# Patient Record
Sex: Male | Born: 1980 | Race: White | Hispanic: No | Marital: Married | Smoking: Current every day smoker
Health system: Southern US, Community
[De-identification: ages and names within clinical notes are randomized; demographics above are authoritative.]

## PROBLEM LIST (undated history)

## (undated) HISTORY — PX: CERVICAL FUSION: SHX112

## (undated) HISTORY — PX: CEREBRAL ANEURYSM REPAIR: SHX164

---

## 2013-07-22 ENCOUNTER — Emergency Department: Payer: Self-pay | Admitting: Emergency Medicine

## 2015-09-06 ENCOUNTER — Other Ambulatory Visit: Payer: Self-pay | Admitting: Neurology

## 2015-09-06 DIAGNOSIS — Z8249 Family history of ischemic heart disease and other diseases of the circulatory system: Secondary | ICD-10-CM

## 2015-09-06 DIAGNOSIS — G44229 Chronic tension-type headache, not intractable: Secondary | ICD-10-CM

## 2015-09-18 ENCOUNTER — Ambulatory Visit
Admission: RE | Admit: 2015-09-18 | Discharge: 2015-09-18 | Disposition: A | Discharge status: Home / Self Care | Payer: Managed Care, Other (non HMO) | Source: Ambulatory Visit | Attending: Neurology | Admitting: Neurology

## 2015-09-18 DIAGNOSIS — G44229 Chronic tension-type headache, not intractable: Secondary | ICD-10-CM | POA: Insufficient documentation

## 2015-09-18 DIAGNOSIS — Z8249 Family history of ischemic heart disease and other diseases of the circulatory system: Secondary | ICD-10-CM | POA: Insufficient documentation

## 2015-12-07 ENCOUNTER — Emergency Department
Admission: EM | Admit: 2015-12-07 | Discharge: 2015-12-07 | Disposition: A | Discharge status: Home / Self Care | Payer: Managed Care, Other (non HMO) | Attending: Emergency Medicine | Admitting: Emergency Medicine

## 2015-12-07 ENCOUNTER — Emergency Department: Payer: Managed Care, Other (non HMO)

## 2015-12-07 ENCOUNTER — Encounter: Payer: Self-pay | Admitting: Emergency Medicine

## 2015-12-07 DIAGNOSIS — F17210 Nicotine dependence, cigarettes, uncomplicated: Secondary | ICD-10-CM | POA: Diagnosis not present

## 2015-12-07 DIAGNOSIS — X501XXA Overexertion from prolonged static or awkward postures, initial encounter: Secondary | ICD-10-CM | POA: Insufficient documentation

## 2015-12-07 DIAGNOSIS — Y929 Unspecified place or not applicable: Secondary | ICD-10-CM | POA: Insufficient documentation

## 2015-12-07 DIAGNOSIS — S99912A Unspecified injury of left ankle, initial encounter: Secondary | ICD-10-CM | POA: Diagnosis present

## 2015-12-07 DIAGNOSIS — S93402A Sprain of unspecified ligament of left ankle, initial encounter: Secondary | ICD-10-CM | POA: Insufficient documentation

## 2015-12-07 DIAGNOSIS — Y9383 Activity, rough housing and horseplay: Secondary | ICD-10-CM | POA: Insufficient documentation

## 2015-12-07 DIAGNOSIS — Y999 Unspecified external cause status: Secondary | ICD-10-CM | POA: Diagnosis not present

## 2015-12-07 MED ORDER — IBUPROFEN 800 MG PO TABS
800.0000 mg | ORAL_TABLET | Freq: Once | ORAL | Status: AC
  Administered 2015-12-07: 800 mg via ORAL
  Filled 2015-12-07: qty 1

## 2015-12-07 MED ORDER — OXYCODONE-ACETAMINOPHEN 5-325 MG PO TABS
1.0000 | ORAL_TABLET | Freq: Once | ORAL | Status: AC
  Administered 2015-12-07: 1 via ORAL
  Filled 2015-12-07: qty 1

## 2015-12-07 MED ORDER — TRAMADOL HCL 50 MG PO TABS: 50.0000 mg | ORAL_TABLET | Freq: Four times a day (QID) | ORAL | 0 refills | Status: DC | PRN

## 2015-12-07 MED ORDER — IBUPROFEN 800 MG PO TABS: 800.0000 mg | ORAL_TABLET | Freq: Three times a day (TID) | ORAL | 0 refills | Status: DC | PRN

## 2015-12-07 NOTE — ED Notes (Signed)
Pt verbalized understanding of discharge instructions. NAD at this time. 

## 2015-12-07 NOTE — ED Triage Notes (Signed)
Pt presents to ED c/o L ankle injury. Pt states he was horsing around with a friend and L ankle got pinned between friend and couch. Swelling noted. Able to move toes.

## 2015-12-07 NOTE — ED Provider Notes (Signed)
[facility] Emergency Department Provider Note   ____________________________________________   First MD Initiated Contact with Patient 12/07/15 1430     (approximate)  I have reviewed the triage vital signs and the nursing notes.   HISTORY  Chief Complaint Ankle Pain (Left)    HPI Lucas SentersJeffrey L Gibson is a 35 y.o. male patient came to the ED complaining of left ankle pain and edema. Patient state his plan is for him and they both felt that his ankle pain between his friend a couch. Patient state immediate pain and edema. Patient is sent occurred approximately 2 AM this morning. Patient stated no palliative measures taken for this complaint.Patient rates his pain as 8/10. Patient had a pain as "throbbing".   History reviewed. No pertinent past medical history.  There are no active problems to display for this patient.   Past Surgical History:  Procedure Laterality Date  . CEREBRAL ANEURYSM REPAIR    . CERVICAL FUSION      Prior to Admission medications   Medication Sig Start Date End Date Taking? Authorizing Provider  ibuprofen (ADVIL,MOTRIN) 800 MG tablet Take 1 tablet (800 mg total) by mouth every 8 (eight) hours as needed for moderate pain. 12/07/15   Joni Reiningonald K Caylor Cerino, PA-C  traMADol (ULTRAM) 50 MG tablet Take 1 tablet (50 mg total) by mouth every 6 (six) hours as needed for moderate pain. 12/07/15   Joni Reiningonald K Taunja Brickner, PA-C    Allergies Bee venom and Amoxicillin  History reviewed. No pertinent family history.  Social History Social History  Substance Use Topics  . Smoking status: Current Every Day Smoker    Packs/day: 1.00    Types: Cigarettes  . Smokeless tobacco: Never Used  . Alcohol use Yes    Review of Systems Constitutional: No fever/chills Eyes: No visual changes. ENT: No sore throat. Cardiovascular: Denies chest pain. Respiratory: Denies shortness of breath. Gastrointestinal: No abdominal pain.  No nausea, no vomiting.  No  diarrhea.  No constipation. Genitourinary: Negative for dysuria. Musculoskeletal: Left ankle pain  Skin: Negative for rash. Neurological: Negative for headaches, focal weakness or numbness. Allergic/Immunilogical: Stable amoxicillin  ____________________________________________   PHYSICAL EXAM:  VITAL SIGNS: ED Triage Vitals [12/07/15 1418]  Enc Vitals Group     BP 120/73     Pulse Rate (!) 113     Resp 18     Temp 98.2 F (36.8 C)     Temp Source Oral     SpO2 96 %     Weight 200 lb (90.7 kg)     Height 6\' 2"  (1.88 m)     Head Circumference      Peak Flow      Pain Score 8     Pain Loc      Pain Edu?      Excl. in GC?     Constitutional: Alert and oriented. Well appearing and in no acute distress. Eyes: Conjunctivae are normal. PERRL. EOMI. Head: Atraumatic. Nose: No congestion/rhinnorhea. Mouth/Throat: Mucous membranes are moist.  Oropharynx non-erythematous. Neck: No stridor.  No cervical spine tenderness to palpation. Hematological/Lymphatic/Immunilogical: No cervical lymphadenopathy. Cardiovascular: Normal rate, regular rhythm. Grossly normal heart sounds.  Good peripheral circulation. Respiratory: Normal respiratory effort.  No retractions. Lungs CTAB. Gastrointestinal: Soft and nontender. No distention. No abdominal bruits. No CVA tenderness. Musculoskeletal: No lower extremity tenderness nor edema.  No joint effusions. Neurologic:  Normal speech and language. No gross focal neurologic deficits are appreciated. No gait instability. Skin:  Skin is  warm, dry and intact. No rash noted. Psychiatric: Mood and affect are normal. Speech and behavior are normal.  ____________________________________________   LABS (all labs ordered are listed, but only abnormal results are displayed)  Labs Reviewed - No data to display ____________________________________________  EKG   ____________________________________________  RADIOLOGY   __X-ray left ankle film only  soft tissue edema. __________________________________________   PROCEDURES  Procedure(s) performed: None  Procedures  Critical Care performed: No  ____________________________________________   INITIAL IMPRESSION / ASSESSMENT AND PLAN / ED COURSE  Pertinent labs & imaging results that were available during my care of the patient were reviewed by me and considered in my medical decision making (see chart for details).  Left ankle sprain. Patient given discharge care instruction. Patient placed in ankle stirrup splint. Patient given prescription for ibuprofen and tramadol. Patient given a work note.  Clinical Course      ____________________________________________   FINAL CLINICAL IMPRESSION(S) / ED DIAGNOSES  Final diagnoses:  Sprain of left ankle, unspecified ligament, initial encounter      NEW MEDICATIONS STARTED DURING THIS VISIT:  New Prescriptions   IBUPROFEN (ADVIL,MOTRIN) 800 MG TABLET    Take 1 tablet (800 mg total) by mouth every 8 (eight) hours as needed for moderate pain.   TRAMADOL (ULTRAM) 50 MG TABLET    Take 1 tablet (50 mg total) by mouth every 6 (six) hours as needed for moderate pain.     Note:  This document was prepared using Dragon voice recognition software and may include unintentional dictation errors.    Joni ReiningRonald K Sarkis Rhines, PA-C 12/07/15 1510    Myrna Blazeravid Matthew Schaevitz, MD 12/07/15 [phone]

## 2016-05-21 ENCOUNTER — Other Ambulatory Visit: Payer: Self-pay | Admitting: Surgery

## 2016-05-21 DIAGNOSIS — G44229 Chronic tension-type headache, not intractable: Secondary | ICD-10-CM

## 2016-06-02 ENCOUNTER — Ambulatory Visit: Payer: Managed Care, Other (non HMO)

## 2016-06-05 ENCOUNTER — Ambulatory Visit
Admission: RE | Admit: 2016-06-05 | Discharge: 2016-06-05 | Disposition: A | Discharge status: Home / Self Care | Payer: Commercial Managed Care - PPO | Source: Ambulatory Visit | Attending: Surgery | Admitting: Surgery

## 2016-06-05 DIAGNOSIS — G44229 Chronic tension-type headache, not intractable: Secondary | ICD-10-CM | POA: Diagnosis present

## 2016-08-10 ENCOUNTER — Other Ambulatory Visit
Admit: 2016-08-10 | Discharge: 2016-08-10 | Disposition: A | Discharge status: Home / Self Care | Attending: Family Medicine | Admitting: Family Medicine

## 2016-08-10 NOTE — ED Triage Notes (Signed)
Breath analysis completed by this tech

## 2018-11-03 ENCOUNTER — Encounter: Payer: Self-pay | Admitting: Emergency Medicine

## 2018-11-03 ENCOUNTER — Ambulatory Visit
Admission: EM | Admit: 2018-11-03 | Discharge: 2018-11-03 | Disposition: A | Discharge status: Home / Self Care | Payer: BC Managed Care – PPO | Attending: Urgent Care | Admitting: Urgent Care

## 2018-11-03 ENCOUNTER — Ambulatory Visit (INDEPENDENT_AMBULATORY_CARE_PROVIDER_SITE_OTHER)
Admit: 2018-11-03 | Discharge: 2018-11-03 | Disposition: A | Discharge status: Home / Self Care | Payer: BC Managed Care – PPO | Attending: Urgent Care | Admitting: Urgent Care

## 2018-11-03 ENCOUNTER — Other Ambulatory Visit: Payer: Self-pay

## 2018-11-03 DIAGNOSIS — M545 Low back pain: Secondary | ICD-10-CM

## 2018-11-03 DIAGNOSIS — N200 Calculus of kidney: Secondary | ICD-10-CM | POA: Diagnosis not present

## 2018-11-03 LAB — URINALYSIS, COMPLETE (UACMP) WITH MICROSCOPIC
Glucose, UA: NEGATIVE mg/dL
Leukocytes,Ua: NEGATIVE
Nitrite: NEGATIVE
Protein, ur: 30 mg/dL — AB
RBC / HPF: >50 RBC/hpf (ref 0–5)
Specific Gravity, Urine: 1.025 (ref 1.005–1.030)
pH: 6 (ref 5.0–8.0)

## 2018-11-03 MED ORDER — ONDANSETRON 8 MG PO TBDP
8.0000 mg | ORAL_TABLET | Freq: Once | ORAL | Status: AC
  Administered 2018-11-03: 8 mg via ORAL

## 2018-11-03 MED ORDER — KETOROLAC TROMETHAMINE 60 MG/2ML IM SOLN
60.0000 mg | Freq: Once | INTRAMUSCULAR | Status: AC
  Administered 2018-11-03: 60 mg via INTRAMUSCULAR

## 2018-11-03 MED ORDER — OXYCODONE-ACETAMINOPHEN 5-325 MG PO TABS: 1.0000 | ORAL_TABLET | Freq: Three times a day (TID) | ORAL | 0 refills | Status: AC | PRN

## 2018-11-03 MED ORDER — TAMSULOSIN HCL 0.4 MG PO CAPS: 0.4000 mg | ORAL_CAPSULE | Freq: Every day | ORAL | 0 refills | Status: AC

## 2018-11-03 MED ORDER — KETOROLAC TROMETHAMINE 10 MG PO TABS: 10.0000 mg | ORAL_TABLET | Freq: Three times a day (TID) | ORAL | 0 refills | Status: AC | PRN

## 2018-11-03 NOTE — ED Provider Notes (Signed)
[place], [place]   Name: Lucas SentersJeffrey L Johanning DOB: Apr 28, 1980 MRN: [id number] CSN: [id number] PCP: Leim FabryAldridge, Barbara, MD  Arrival date and time:  11/03/18 1301  Chief Complaint:  Back Pain and Emesis   NOTE: Prior to seeing the patient today, I have reviewed the triage nursing documentation and vital signs. Clinical staff has updated patient's PMH/PSHx, current medication list, and drug allergies/intolerances to ensure comprehensive history available to assist in medical decision making.   History:   HPI: Lucas Gibson is a 38 y.o. male who presents today with complaints of RIGHT lower back pain with (+) radiation into his RIGHT flank and groin that began with acute onset today at 0230. Patient notes nausea, vomiting, and decreased ability to void that following the onset of his pain; began around 0900. Patient able to eat and drink normally, however due to the pain (10/10), he notes an overall decrease in his appetite. Patient has not experienced any associated fevers. Urine has been noted to be dark and concentrated. He has appreciated gross hematuria. Patient has noted noticed any odor to his urine. He states, "I have been able to pee, but I have still be able to go". Patient reports a questionable history of urolithiasis; "they think that I passed one awhile back, but they don't know for sure". Patient presents to clinic today in obvious discomfort. He is not diaphoretic or hypertensive. He is observed shifting back and forth on the stretcher.   History reviewed. No pertinent past medical history.  Past Surgical History:  Procedure Laterality Date  . CEREBRAL ANEURYSM REPAIR    . CERVICAL FUSION      History reviewed. No pertinent family history.  Social History   Tobacco Use  . Smoking status: Current Every Day Smoker    Packs/day: 1.00    Types: Cigarettes  . Smokeless tobacco: Never Used  Substance Use Topics  . Alcohol use: Yes  . Drug use: No    There are no active  problems to display for this patient.   Home Medications:    No outpatient medications have been marked as taking for the 11/03/18 encounter [facility](Hospital Encounter).    Allergies:   Bee venom and Amoxicillin  Review of Systems (ROS): Review of Systems  Constitutional: Negative for chills and fever.  Respiratory: Negative for cough and shortness of breath.   Cardiovascular: Negative for chest pain and palpitations.  Gastrointestinal: Positive for abdominal pain. Negative for diarrhea, nausea and vomiting.  Genitourinary: Positive for difficulty urinating, dysuria, flank pain, frequency and hematuria. Negative for decreased urine volume, discharge, penile pain, scrotal swelling, testicular pain and urgency.  Musculoskeletal: Positive for back pain.  Skin: Positive for pallor. Negative for color change and rash.  Psychiatric/Behavioral: The patient is nervous/anxious.   All other systems reviewed and are negative.    Vital Signs: Today's Vitals   11/03/18 1311 11/03/18 1312  BP: (!) 132/100   Pulse: 85   Resp: 18   Temp: 98.4 F (36.9 C)   TempSrc: Oral   SpO2: 100%   Weight:  202 lb (91.6 kg)  Height:  6\' 1"  (1.854 m)  PainSc: 10-Worst pain ever     Physical Exam: Physical Exam  Constitutional: He is oriented to person, place, and time. He appears distressed (2/2 acute pain).  HENT:  Head: Normocephalic and atraumatic.  Mouth/Throat: Mucous membranes are normal.  Eyes: Pupils are equal, round, and reactive to light. EOM are normal.  Neck: Normal range of motion. Neck supple. No  tracheal deviation present.  Cardiovascular: Normal rate, regular rhythm, normal heart sounds and intact distal pulses. Exam reveals no gallop and no friction rub.  No murmur heard. Pulmonary/Chest: Effort normal and breath sounds normal. No respiratory distress. He has no wheezes. He has no rales.  Abdominal: Soft. Normal appearance. He exhibits no distension. There is abdominal tenderness in the  suprapubic area. There is CVA tenderness (RIGHT).  Neurological: He is alert and oriented to person, place, and time. Gait normal.  Skin: Skin is warm and dry. No rash noted.  Psychiatric: Memory, affect and judgment normal. His mood appears anxious.  Nursing note and vitals reviewed.   Urgent Care Treatments / Results:   LABS: PLEASE NOTE: all labs that were ordered this encounter are listed, however only abnormal results are displayed. Labs Reviewed  URINALYSIS, COMPLETE (UACMP) WITH MICROSCOPIC - Abnormal; Notable for the following components:      Result Value   APPearance CLOUDY (*)    Hgb urine dipstick LARGE (*)    Bilirubin Urine SMALL (*)    Ketones, ur TRACE (*)    Protein, ur 30 (*)    Bacteria, UA FEW (*)    All other components within normal limits    EKG: -None  RADIOLOGY: Ct Renal Stone Study  Result Date: 11/03/2018 CLINICAL DATA:  Onset right back pain radiating into the right groin at 2:30 a.m. last night. No known injury. EXAM: CT ABDOMEN AND PELVIS WITHOUT CONTRAST TECHNIQUE: Multidetector CT imaging of the abdomen and pelvis was performed following the standard protocol without IV contrast. COMPARISON:  None. FINDINGS: Lower chest: Lung bases clear.  No pleural or pericardial effusion. Hepatobiliary: No focal liver abnormality is seen. No gallstones, gallbladder wall thickening, or biliary dilatation. Pancreas: Unremarkable. No pancreatic ductal dilatation or surrounding inflammatory changes. Spleen: Normal in size without focal abnormality. Adrenals/Urinary Tract: The adrenal glands are normal. There is mild right hydronephrosis with stranding about the right kidney and ureter due to a 0.3 cm stone at the right UVJ. The kidneys are otherwise normal in appearance. No other stones are identified. Urinary bladder appears normal. Stomach/Bowel: Stomach is within normal limits. Appendix appears normal. No evidence of bowel wall thickening, distention, or inflammatory  changes. Vascular/Lymphatic: Aortic atherosclerosis. No enlarged abdominal or pelvic lymph nodes. Reproductive: Prostate is unremarkable. Other: Small fat containing umbilical and left inguinal hernia is noted. Musculoskeletal: Negative. IMPRESSION: Mild right hydronephrosis due to a 0.3 cm stone at the right UVJ. No other urinary tract stones are identified. Atherosclerosis. Small fat containing umbilical and left inguinal hernias. Electronically Signed   By: Drusilla Kanner M.D.   On: 11/03/2018 14:26    PROCEDURES: Procedures  MEDICATIONS RECEIVED THIS VISIT: Medications  ketorolac (TORADOL) injection 60 mg (60 mg Intramuscular Given 11/03/18 1331)  ondansetron (ZOFRAN-ODT) disintegrating tablet 8 mg (8 mg Oral Given 11/03/18 1333)    PERTINENT CLINICAL COURSE NOTES/UPDATES: Clinical Course as of Nov 02 1436  Thu Nov 03, 2018  1410 Patient reports feeling better after the IM ketorolac and PO ondansetron. Pain is down to a 3/10. He is noted to be much calmer and is not shifting in bed. Pending CT scan, which has been delayed for insurance approval. Imaging approval has been obtained by clinic CMA.    [BG]    Clinical Course User Index [BG] Verlee Monte, NP   Initial Impression / Assessment and Plan / Urgent Care Course:  Pertinent labs & imaging results that were available during my care of the  patient were personally reviewed by me and considered in my medical decision making (see lab/imaging section of note for values and interpretations).  ARMANY MANO is a 38 y.o. male who presents to [facility] today with complaints of Back Pain and Emesis   Patient is well appearing overall in clinic today. He does not appear to be in any acute distress. Presenting symptoms (see HPI) and exam as documented above. Patient distressed upon arrival. (+) N/V. He was given ondansetron PO and IM ketorolac. Symptoms improved. Given his significant pain, N/V, and history of possible stone in  the past, the decision was made to proceed with CT imaging to assess for urolithiasis and associated obstructive uropathy.   CT imaging of the abdomen and pelvis without contrast revealed a 3 mm stone at the RIGHT UVJ that is causing mild hydronephrosis. Patient is able to void. He has no fevers. Will treat as follows:  Tamsulosin 0.4 mg PO daily  Ketorolac 10 mg tablet q8h PRN  Percocet 5/325 mg q8h PRN  Stain all urine and follow up with urology. Name and office contact information provided on today's AVS for McG[place], PA-C. Patient advised the he will need to contact the office to schedule an appointment to be seen.   Current clinical condition warrants patient being out of work in order to recover from his current injury/illness. Patient does not have to be back to work until Monday, however since he was sent home today, his manager is asking a clearance to RTW on Monday.  He was provided with the appropriate documentation to provide to his place of employment that will allow for him to RTW on 11/07/2018 with no restrictions.   Discussed follow up with urology within the next wee for re-evaluation. I have reviewed the follow up and strict return precautions for any new or worsening symptoms. Patient is aware of symptoms that would be deemed urgent/emergent, and would thus require further evaluation either here or in the emergency department. At the time of discharge, he verbalized understanding and consent with the discharge plan as it was reviewed with him. All questions were fielded by provider and/or clinic staff prior to patient discharge.    Final Clinical Impressions / Urgent Care Diagnoses:   Final diagnoses:  Kidney stone    New Prescriptions:  [place] Controlled Substance Registry consulted? Yes, I have consulted the [place] Controlled Substances Registry for this patient, and feel the risk/benefit ratio today is favorable for proceeding with this prescription for a controlled substance.   . Discussed use of controlled substance medication to treat his acute pain.  o Reviewed [place] STOP Act regulations  o Clinic does not refill controlled substances over the phone without face to face evaluation.  . Safety precautions reviewed.  o Medications should not be bitten, chewed, crushed, shared, or taken with alcohol.  o Avoid use while working, driving, or operating heavy machinery.  o Side effects associated with the use of this particular medication reviewed. - Patient understands that this medication can cause CNS depression, increase his risk of falls, and even lead to overdose that may result in death, if used outside of the parameters that he and I discussed.  With all of this in mind, he knowingly accepts the risks and responsibilities associated with intended course of treatment, and elects to responsibly proceed as discussed.  Meds ordered this encounter  Medications  . ketorolac (TORADOL) injection 60 mg  . ondansetron (ZOFRAN-ODT) disintegrating tablet 8 mg  . tamsulosin (  FLOMAX) 0.4 MG CAPS capsule    Sig: Take 1 capsule (0.4 mg total) by mouth daily.    Dispense:  14 capsule    Refill:  0  . oxyCODONE-acetaminophen (PERCOCET) 5-325 MG tablet    Sig: Take 1 tablet by mouth every 8 (eight) hours as needed for severe pain.    Dispense:  12 tablet    Refill:  0  . ketorolac (TORADOL) 10 MG tablet    Sig: Take 1 tablet (10 mg total) by mouth every 8 (eight) hours as needed.    Dispense:  20 tablet    Refill:  0    Recommended Follow up Care:  Patient encouraged to follow up with the following provider within the specified time frame, or sooner as dictated by the severity of his symptoms. As always, he was instructed that for any urgent/emergent care needs, he should seek care either here or in the emergency department for more immediate evaluation.  Follow-up Information    Michiel Cowboy A, PA-C In 1 week.   Specialties: Urology, Radiology Why: General reassessment  of symptoms if not improving Contact information: [address] [place] [address] [phone]         NOTE: This note was prepared using Dragon dictation software along with smaller phrase technology. Despite my best ability to proofread, there is the potential that transcriptional errors may still occur from this process, and are completely unintentional.    Verlee Monte, NP 11/03/18 1630

## 2018-11-03 NOTE — Discharge Instructions (Signed)
It was very nice seeing you today in clinic. Thank you for entrusting me with your care.   Strain all urine. If you capture stone, take it with you to urology and they can send off for testing which will assist with prevention strategies. Increase fluid intake as much as possible (water is best). If unable to urinate, go to the ER.   Make arrangements to follow up with urology within the next week for re-evaluation. I have provided you the name and office contact information for an excellent local provider. If your symptoms/condition worsens, please seek follow up care either here or in the ER. Please remember, our [place] providers are "right here with you" when you need [place].   Again, it was my pleasure to take care of you today. Thank you for choosing our clinic. I hope that you start to feel better quickly.   Honor Loh, MSN, APRN, FNP-C, CEN Advanced Practice Provider [facility]

## 2018-11-03 NOTE — ED Notes (Signed)
No prior authorization needed per [employer]. [facility]

## 2018-11-03 NOTE — ED Triage Notes (Signed)
Patient c/o right side back pain that radiates into his right groin area that started around 2:30am. He is also c/o dysuria and started vomiting from the pain this morning around 9am.

## 2020-03-28 IMAGING — CT CT RENAL STONE PROTOCOL
1 of 2 series · 15 of 32 positions shown, 19 images · non-contrast
Comparison: None.

CLINICAL DATA: Onset right back pain radiating into the right groin
at [DATE] a.m. last night. No known injury.

EXAM:
CT ABDOMEN AND PELVIS WITHOUT CONTRAST
TECHNIQUE: Multidetector CT imaging of the abdomen and pelvis was performed
following the standard protocol without IV contrast.

[Series 2: axial st · axial · 0.88mm/px · z∈[-1185,-735]mm · 15 of 98 slices shown, 19 images]
[im 4/98  soft-tissue]
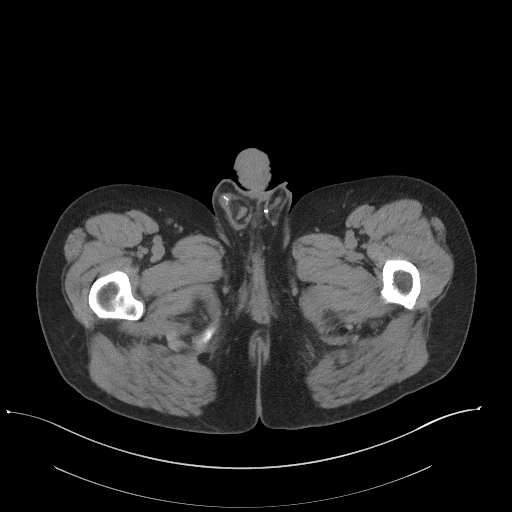
[im 4/98  bone]
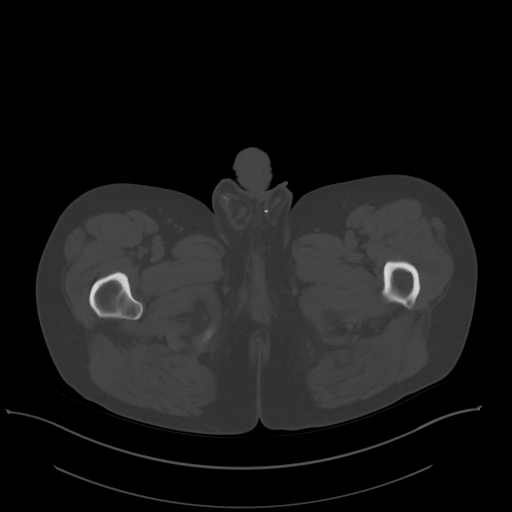
[im 12/98  soft-tissue]
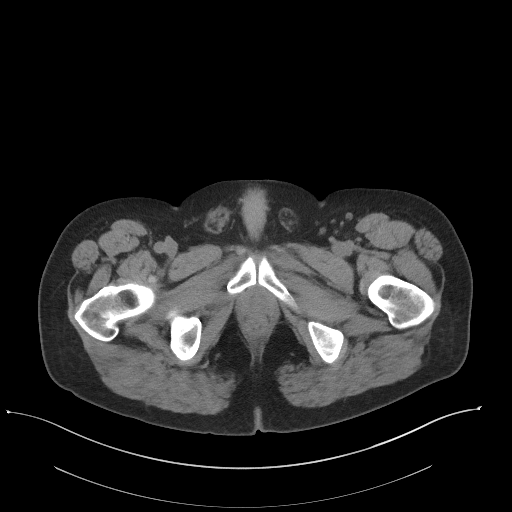
[im 20/98  soft-tissue]
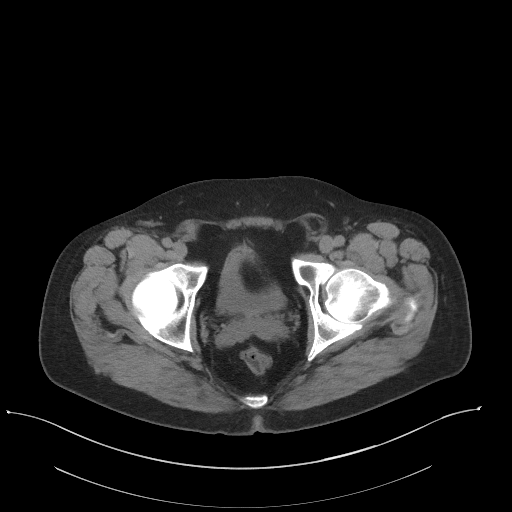
[im 28/98  soft-tissue]
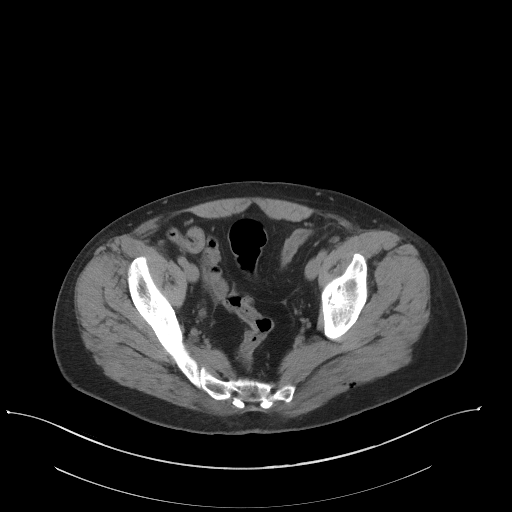
[im 35/98  soft-tissue]
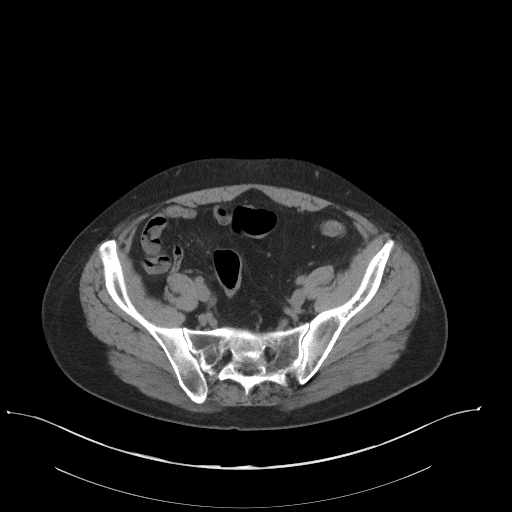
[im 43/98  soft-tissue]
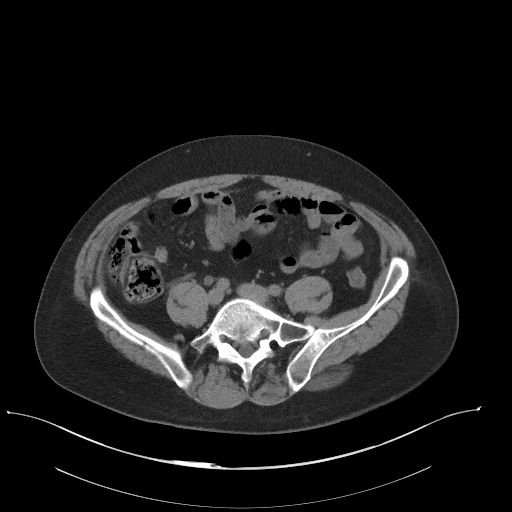
[im 51/98  soft-tissue]
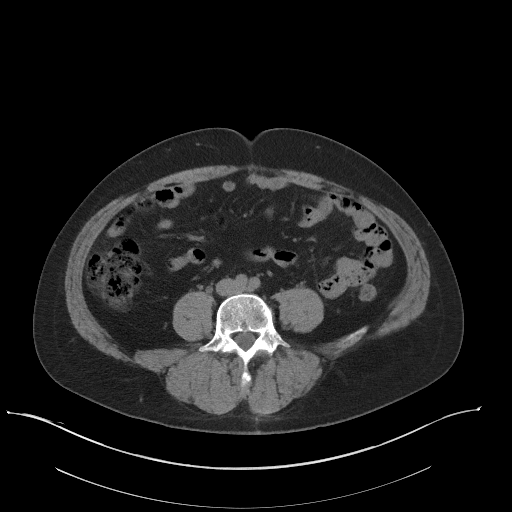
[im 55/98  soft-tissue]
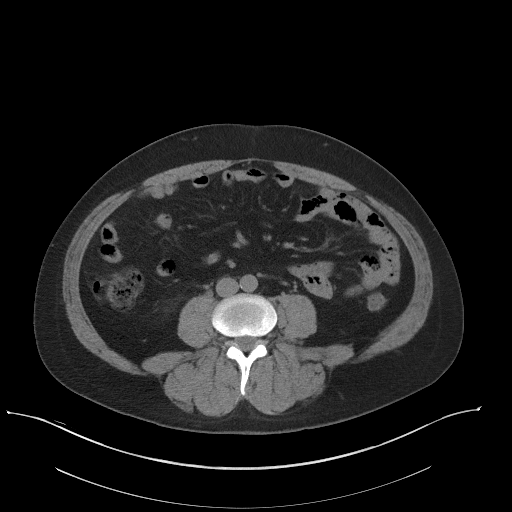
[im 63/98  soft-tissue]
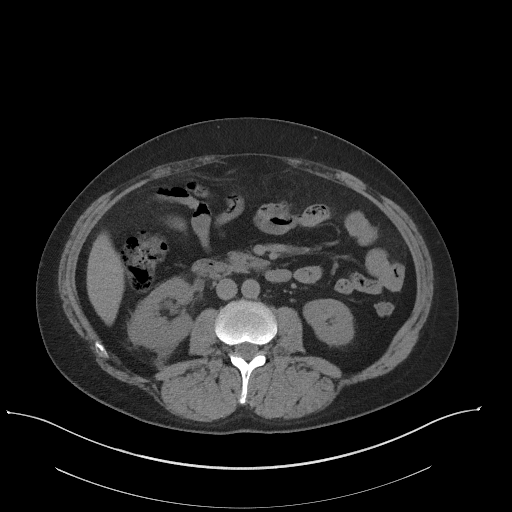
[im 63/98  bone]
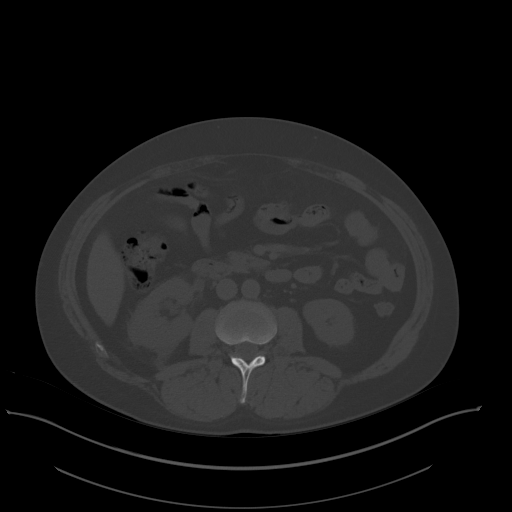
[im 70/98  soft-tissue]
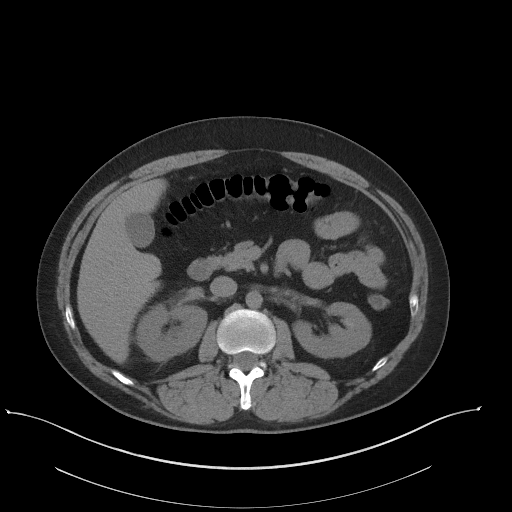
[im 78/98  soft-tissue]
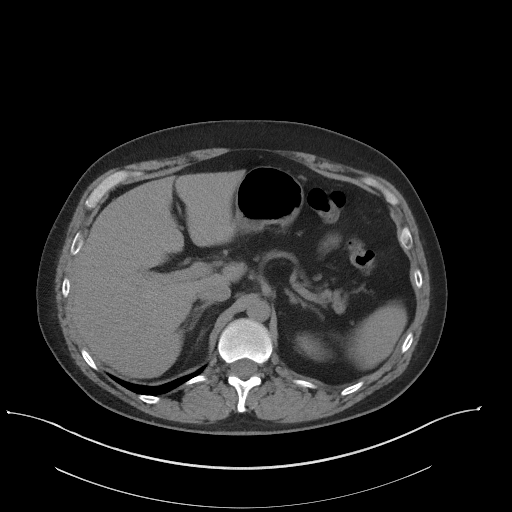
[im 82/98  lung]
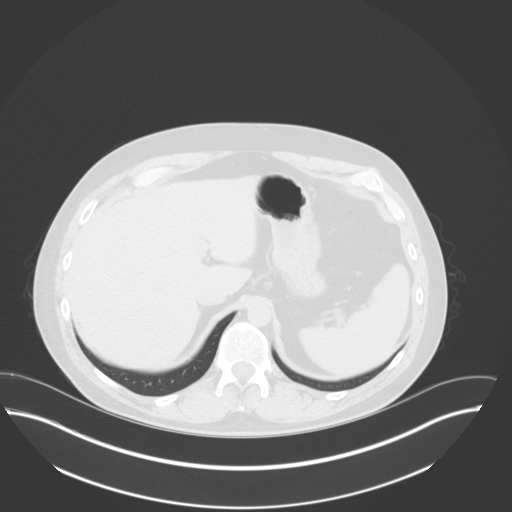
[im 86/98  soft-tissue]
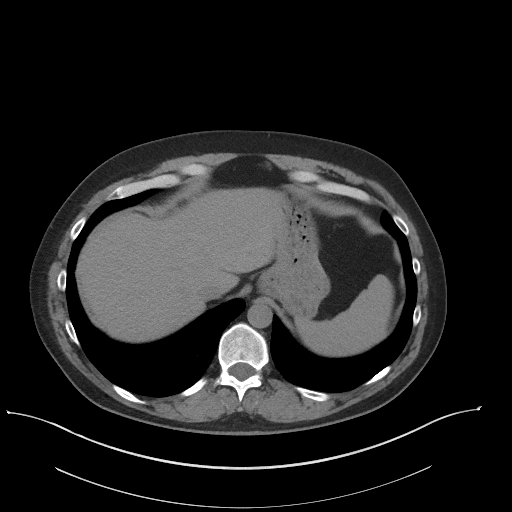
[im 86/98  lung]
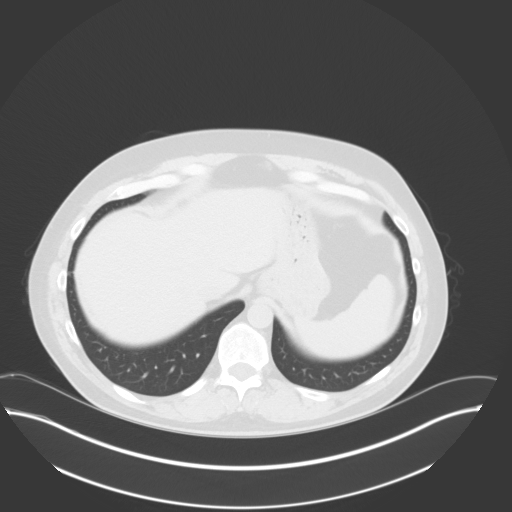
[im 90/98  lung]
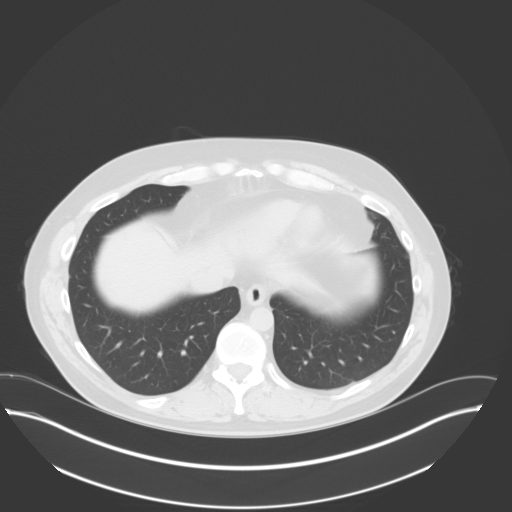
[im 94/98  soft-tissue]
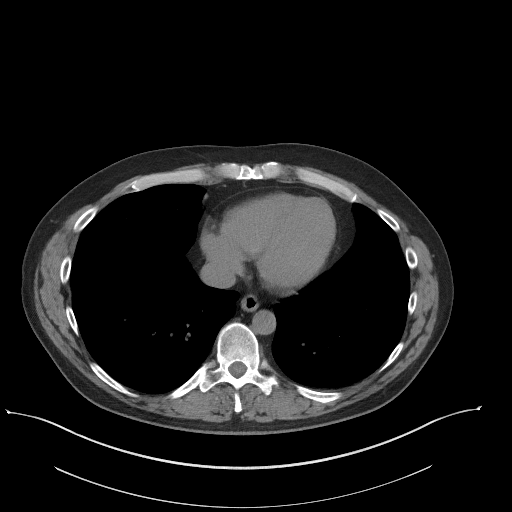
[im 94/98  lung]
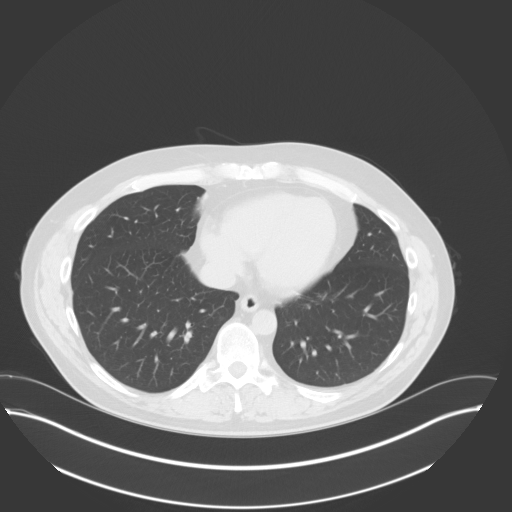

[15 of 32 positions shown; findings below may reference images not displayed]

FINDINGS: Lower chest: Lung bases clear.  No pleural or pericardial effusion.

Hepatobiliary: No focal liver abnormality is seen. No gallstones,
gallbladder wall thickening, or biliary dilatation.

Pancreas: Unremarkable. No pancreatic ductal dilatation or
surrounding inflammatory changes.

Spleen: Normal in size without focal abnormality.

Adrenals/Urinary Tract: The adrenal glands are normal. There is mild
right hydronephrosis with stranding about the right kidney and
ureter due to a 0.3 cm stone at the right UVJ. The kidneys are
otherwise normal in appearance. No other stones are identified.
Urinary bladder appears normal.

Stomach/Bowel: Stomach is within normal limits. Appendix appears
normal. No evidence of bowel wall thickening, distention, or
inflammatory changes.

Vascular/Lymphatic: Aortic atherosclerosis. No enlarged abdominal or
pelvic lymph nodes.

Reproductive: Prostate is unremarkable.

Other: Small fat containing umbilical and left inguinal hernia is
noted.

Musculoskeletal: Negative.
IMPRESSION: Mild right hydronephrosis due to a 0.3 cm stone at the right UVJ. No
other urinary tract stones are identified.

Atherosclerosis.

Small fat containing umbilical and left inguinal hernias.
